# Patient Record
Sex: Male | Born: 1995 | Race: White | Hispanic: No | Marital: Single | Smoking: Never smoker
Health system: Southern US, Community
[De-identification: ages and names within clinical notes are randomized; demographics above are authoritative.]

---

## 2015-01-08 ENCOUNTER — Emergency Department (HOSPITAL_COMMUNITY): Payer: BLUE CROSS/BLUE SHIELD

## 2015-01-08 ENCOUNTER — Emergency Department (HOSPITAL_COMMUNITY)
Admission: EM | Admit: 2015-01-08 | Discharge: 2015-01-08 | Disposition: A | Discharge status: Home / Self Care | Payer: BLUE CROSS/BLUE SHIELD | Attending: Emergency Medicine | Admitting: Emergency Medicine

## 2015-01-08 ENCOUNTER — Encounter (HOSPITAL_COMMUNITY): Payer: Self-pay | Admitting: *Deleted

## 2015-01-08 DIAGNOSIS — S99922A Unspecified injury of left foot, initial encounter: Secondary | ICD-10-CM | POA: Diagnosis present

## 2015-01-08 DIAGNOSIS — S9032XA Contusion of left foot, initial encounter: Secondary | ICD-10-CM | POA: Insufficient documentation

## 2015-01-08 DIAGNOSIS — Y92320 Baseball field as the place of occurrence of the external cause: Secondary | ICD-10-CM | POA: Diagnosis not present

## 2015-01-08 DIAGNOSIS — Y998 Other external cause status: Secondary | ICD-10-CM | POA: Insufficient documentation

## 2015-01-08 DIAGNOSIS — W2103XA Struck by baseball, initial encounter: Secondary | ICD-10-CM | POA: Insufficient documentation

## 2015-01-08 DIAGNOSIS — Y9364 Activity, baseball: Secondary | ICD-10-CM | POA: Diagnosis not present

## 2015-01-08 DIAGNOSIS — M79673 Pain in unspecified foot: Secondary | ICD-10-CM

## 2015-01-08 MED ORDER — IBUPROFEN 800 MG PO TABS: 800.0000 mg | ORAL_TABLET | Freq: Three times a day (TID) | ORAL | Status: AC

## 2015-01-08 MED ORDER — IBUPROFEN 400 MG PO TABS
800.0000 mg | ORAL_TABLET | Freq: Once | ORAL | Status: AC
  Administered 2015-01-08: 800 mg via ORAL
  Filled 2015-01-08: qty 2

## 2015-01-08 NOTE — ED Notes (Signed)
Declined W/C at D/C and was escorted to lobby by RN. 

## 2015-01-08 NOTE — ED Provider Notes (Signed)
CSN: [id number]     Arrival date & time 01/08/15  1604 History  This chart was scribed for non-physician practitioner, Ladona Mow, PA-C working with Mirian Mo, MD by Greggory Stallion, ED scribe. This patient was seen in room TR09C/TR09C and the patient's care was started at 4:26 PM.    Chief Complaint  Patient presents with  . Foot Injury   The history is provided by the patient. No language interpreter was used.    HPI Comments: Erik Soto is a 19 y.o. male who presents to the Emergency Department complaining of left foot injury that occurred earlier today. He states he was playing baseball when his foot was struck with a batted ball. Pt reports sudden onset pain with associated swelling and tingling. Bearing weight and movements worsen pain. He has not yet taken anything for his symptoms but has used an ACE wrap with no relief.   History reviewed. No pertinent past medical history. History reviewed. No pertinent past surgical history. No family history on file. History  Substance Use Topics  . Smoking status: Never Smoker   . Smokeless tobacco: Not on file  . Alcohol Use: No    Review of Systems  Musculoskeletal: Positive for joint swelling and arthralgias.  All other systems reviewed and are negative.  Allergies  Review of patient's allergies indicates no known allergies.  Home Medications   Prior to Admission medications   Medication Sig Start Date End Date Taking? Authorizing Provider  ibuprofen (ADVIL,MOTRIN) 800 MG tablet Take 1 tablet (800 mg total) by mouth 3 (three) times daily. 01/08/15   Monte Fantasia, PA-C   BP 142/81 mmHg  Pulse 103  Temp(Src) 98.2 F (36.8 C) (Oral)  Resp 18  SpO2 96%   Physical Exam  Constitutional: He is oriented to person, place, and time. He appears well-developed and well-nourished. No distress.  HENT:  Head: Normocephalic and atraumatic.  Eyes: Conjunctivae and EOM are normal.  Neck: Neck supple. No tracheal deviation present.   Cardiovascular: Normal rate.   PT pulses 2+.  Pulmonary/Chest: Effort normal. No respiratory distress.  Musculoskeletal:  Moderate amount of swelling and tenderness to the dorsal aspect of proximal metatarsals of left foot. Decreased ROM secondary to pain. Distal sensation intact. Capillary refill less than 2 seconds distally.  Neurological: He is alert and oriented to person, place, and time.  Skin: Skin is warm and dry.  Psychiatric: He has a normal mood and affect. His behavior is normal.  Nursing note and vitals reviewed.   ED Course  Procedures (including critical care time)  DIAGNOSTIC STUDIES: Oxygen Saturation is 96% on RA, normal by my interpretation.    COORDINATION OF CARE: 4:28 PM-Discussed treatment plan which includes 800 mg ibuprofen and foot and ankle xrays with pt at bedside and pt agreed to plan.   Labs Review Labs Reviewed - No data to display  Imaging Review Dg Ankle Complete Left  01/08/2015   CLINICAL DATA:  Ankle pain and lateral foot pain post basketball injury today  EXAM: LEFT ANKLE COMPLETE - 3+ VIEW  COMPARISON:  None.  FINDINGS: Three views of left ankle submitted. No acute fracture or subluxation. No radiopaque foreign body. Ankle mortise is preserved.  IMPRESSION: Negative.   Electronically Signed   By: Natasha Mead M.D.   On: 01/08/2015 17:14   Dg Foot Complete Left  01/08/2015   CLINICAL DATA:  Left foot injury playing basketball today  EXAM: LEFT FOOT - COMPLETE 3+ VIEW  COMPARISON:  None.  FINDINGS: Three views of the left foot submitted. No acute fracture or subluxation. No radiopaque foreign body.  IMPRESSION: Negative.   Electronically Signed   By: Natasha MeadLiviu  Pop M.D.   On: 01/08/2015 17:14     EKG Interpretation None      MDM   Final diagnoses:  Foot pain  Foot contusion, left, initial encounter    Patient here with proximal metatarsal pain and swelling and mild ankle pain after being hit with a baseball on his foot which came straight off  of the bat. Patient neurovascularly intact, radiographs unremarkable for acute osseous injury. Patient has painful range of motion and moderate amount of swelling, cannot rule out ligamentous injury. We'll place patient in ASO brace and have him follow with orthopedics. Return precautions and RICE therapy discussed, Asian verbalizes understanding and agreement this plan. I encouraged patient to call or return to ER should he have any questions or concerns.  I personally performed the services described in this documentation, which was scribed in my presence. The recorded information has been reviewed and is accurate.  BP 142/81 mmHg  Pulse 103  Temp(Src) 98.2 F (36.8 C) (Oral)  Resp 18  SpO2 96%  Signed,  Ladona MowJoe Seven Dollens, PA-C 5:31 PM   Monte FantasiaJoseph W Carmaleta Youngers, PA-C 01/08/15 1731  Monte FantasiaJoseph W Maily Debarge, PA-C 01/08/15 1759  Mirian MoMatthew Gentry, MD 01/08/15 [phone]

## 2015-01-08 NOTE — ED Notes (Signed)
Painful  Lt foot   He was playing baseball earlier today.  His foot was struck with a batted baseball.

## 2015-01-08 NOTE — Discharge Instructions (Signed)
Foot Contusion A foot contusion is a deep bruise to the foot. Contusions are the result of an injury that caused bleeding under the skin. The contusion may turn blue, purple, or yellow. Minor injuries will give you a painless contusion, but more severe contusions may stay painful and swollen for a few weeks. CAUSES  A foot contusion comes from a direct blow to that area, such as a heavy object falling on the foot. SYMPTOMS   Swelling of the foot.  Discoloration of the foot.  Tenderness or soreness of the foot. DIAGNOSIS  You will have a physical exam and will be asked about your history. You may need an X-ray of your foot to look for a broken bone (fracture).  TREATMENT  An elastic wrap may be recommended to support your foot. Resting, elevating, and applying cold compresses to your foot are often the best treatments for a foot contusion. Over-the-counter medicines may also be recommended for pain control. HOME CARE INSTRUCTIONS   Put ice on the injured area.  Put ice in a plastic bag.  Place a towel between your skin and the bag.  Leave the ice on for 15-20 minutes, 03-04 times a day.  Only take over-the-counter or prescription medicines for pain, discomfort, or fever as directed by your caregiver.  If told, use an elastic wrap as directed. This can help reduce swelling. You may remove the wrap for sleeping, showering, and bathing. If your toes become numb, cold, or blue, take the wrap off and reapply it more loosely.  Elevate your foot with pillows to reduce swelling.  Try to avoid standing or walking while the foot is painful. Do not resume use until instructed by your caregiver. Then, begin use gradually. If pain develops, decrease use. Gradually increase activities that do not cause discomfort until you have normal use of your foot.  See your caregiver as directed. It is very important to keep all follow-up appointments in order to avoid any lasting problems with your foot,  including long-term (chronic) pain. SEEK IMMEDIATE MEDICAL CARE IF:   You have increased redness, swelling, or pain in your foot.  Your swelling or pain is not relieved with medicines.  You have loss of feeling in your foot or are unable to move your toes.  Your foot turns cold or blue.  You have pain when you move your toes.  Your foot becomes warm to the touch.  Your contusion does not improve in 2 days. MAKE SURE YOU:   Understand these instructions.  Will watch your condition.  Will get help right away if you are not doing well or get worse. Document Released: 08/26/2006 Document Revised: 05/05/2012 Document Reviewed: 10/08/2011 [facility] Patient Information 2015 [place], [place]. This information is not intended to replace advice given to you by your health care provider. Make sure you discuss any questions you have with your health care provider.  Ankle Sprain An ankle sprain is an injury to the strong, fibrous tissues (ligaments) that hold the bones of your ankle joint together.  CAUSES An ankle sprain is usually caused by a fall or by twisting your ankle. Ankle sprains most commonly occur when you step on the outer edge of your foot, and your ankle turns inward. People who participate in sports are more prone to these types of injuries.  SYMPTOMS   Pain in your ankle. The pain may be present at rest or only when you are trying to stand or walk.  Swelling.  Bruising. Bruising may  develop immediately or within 1 to 2 days after your injury.  Difficulty standing or walking, particularly when turning corners or changing directions. DIAGNOSIS  Your caregiver will ask you details about your injury and perform a physical exam of your ankle to determine if you have an ankle sprain. During the physical exam, your caregiver will press on and apply pressure to specific areas of your foot and ankle. Your caregiver will try to move your ankle in certain ways. An X-ray exam may be  done to be sure a bone was not broken or a ligament did not separate from one of the bones in your ankle (avulsion fracture).  TREATMENT  Certain types of braces can help stabilize your ankle. Your caregiver can make a recommendation for this. Your caregiver may recommend the use of medicine for pain. If your sprain is severe, your caregiver may refer you to a surgeon who helps to restore function to parts of your skeletal system (orthopedist) or a physical therapist. HOME CARE INSTRUCTIONS   Apply ice to your injury for 1-2 days or as directed by your caregiver. Applying ice helps to reduce inflammation and pain.  Put ice in a plastic bag.  Place a towel between your skin and the bag.  Leave the ice on for 15-20 minutes at a time, every 2 hours while you are awake.  Only take over-the-counter or prescription medicines for pain, discomfort, or fever as directed by your caregiver.  Elevate your injured ankle above the level of your heart as much as possible for 2-3 days.  If your caregiver recommends crutches, use them as instructed. Gradually put weight on the affected ankle. Continue to use crutches or a cane until you can walk without feeling pain in your ankle.  If you have a plaster splint, wear the splint as directed by your caregiver. Do not rest it on anything harder than a pillow for the first 24 hours. Do not put weight on it. Do not get it wet. You may take it off to take a shower or bath.  You may have been given an elastic bandage to wear around your ankle to provide support. If the elastic bandage is too tight (you have numbness or tingling in your foot or your foot becomes cold and blue), adjust the bandage to make it comfortable.  If you have an air splint, you may blow more air into it or let air out to make it more comfortable. You may take your splint off at night and before taking a shower or bath. Wiggle your toes in the splint several times per day to decrease  swelling. SEEK MEDICAL CARE IF:   You have rapidly increasing bruising or swelling.  Your toes feel extremely cold or you lose feeling in your foot.  Your pain is not relieved with medicine. SEEK IMMEDIATE MEDICAL CARE IF:  Your toes are numb or blue.  You have severe pain that is increasing. MAKE SURE YOU:   Understand these instructions.  Will watch your condition.  Will get help right away if you are not doing well or get worse. Document Released: 11/04/2005 Document Revised: 07/29/2012 Document Reviewed: 11/16/2011 [facility] Patient Information 2015 [place]. This information is not intended to replace advice given to you by your health care provider. Make sure you discuss any questions you have with your health care provider.

## 2015-12-16 IMAGING — DX DG ANKLE COMPLETE 3+V*L*
3 series · 3 of 3 positions shown · non-contrast
Comparison: None.

CLINICAL DATA: Ankle pain and lateral foot pain post basketball
injury today

EXAM:
LEFT ANKLE COMPLETE - 3+ VIEW

[ankle ap]
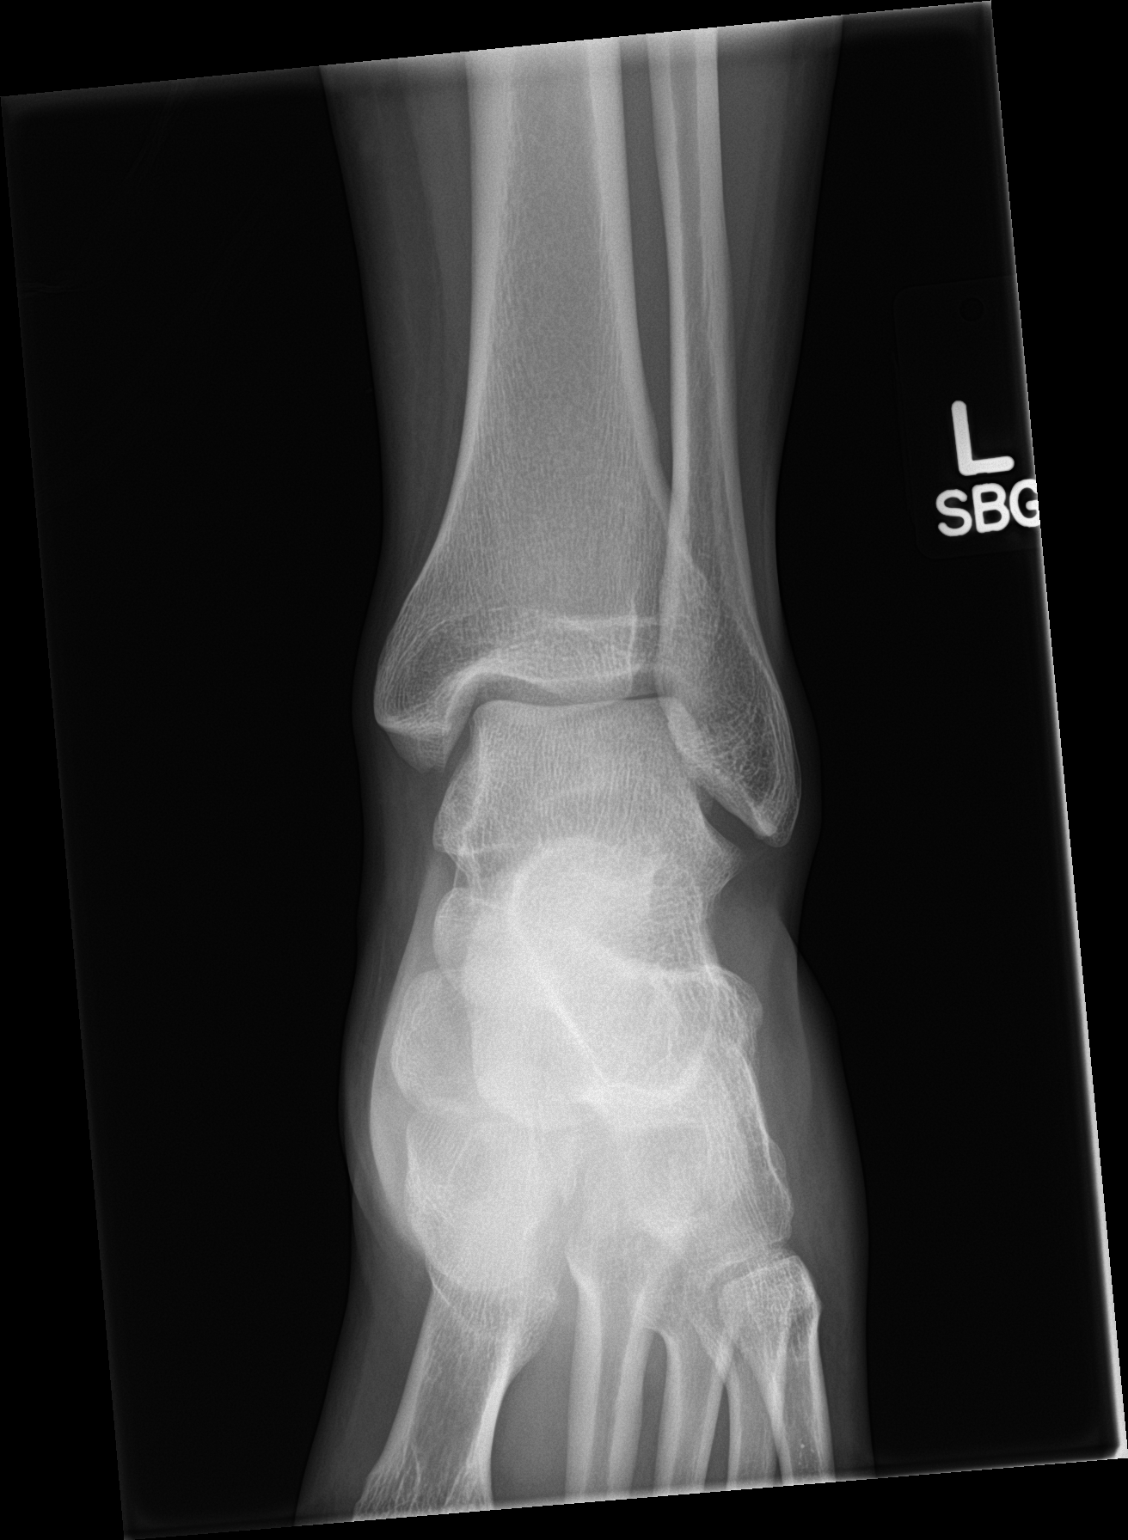

[ankle obl]
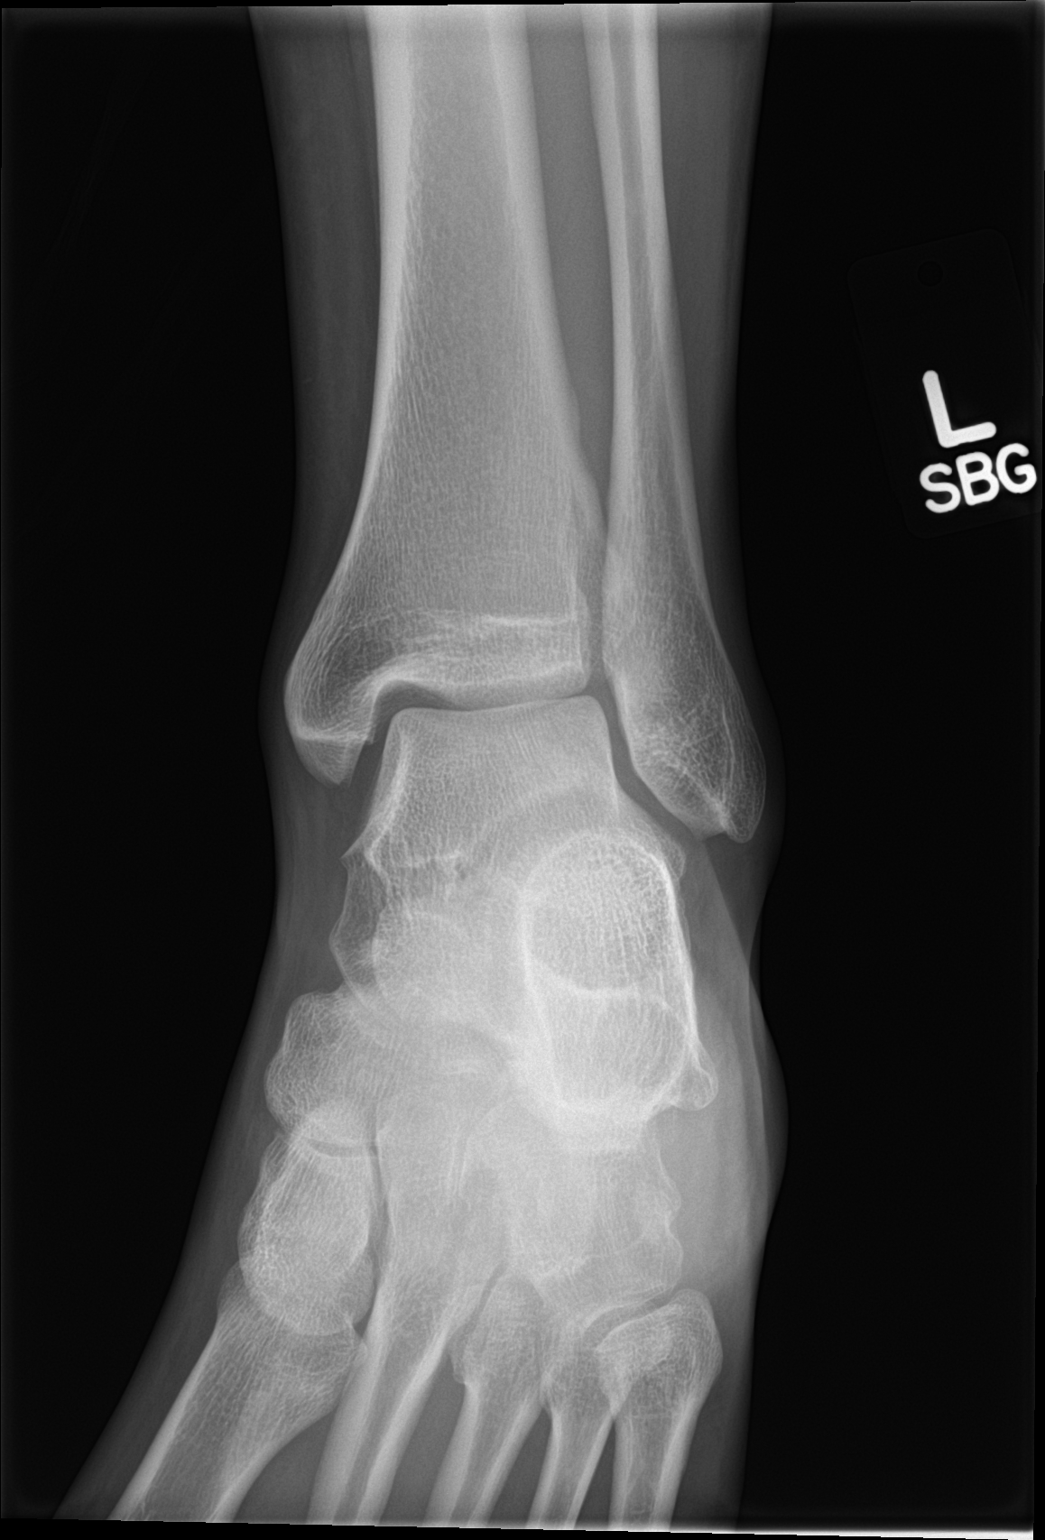

[ankle lat]
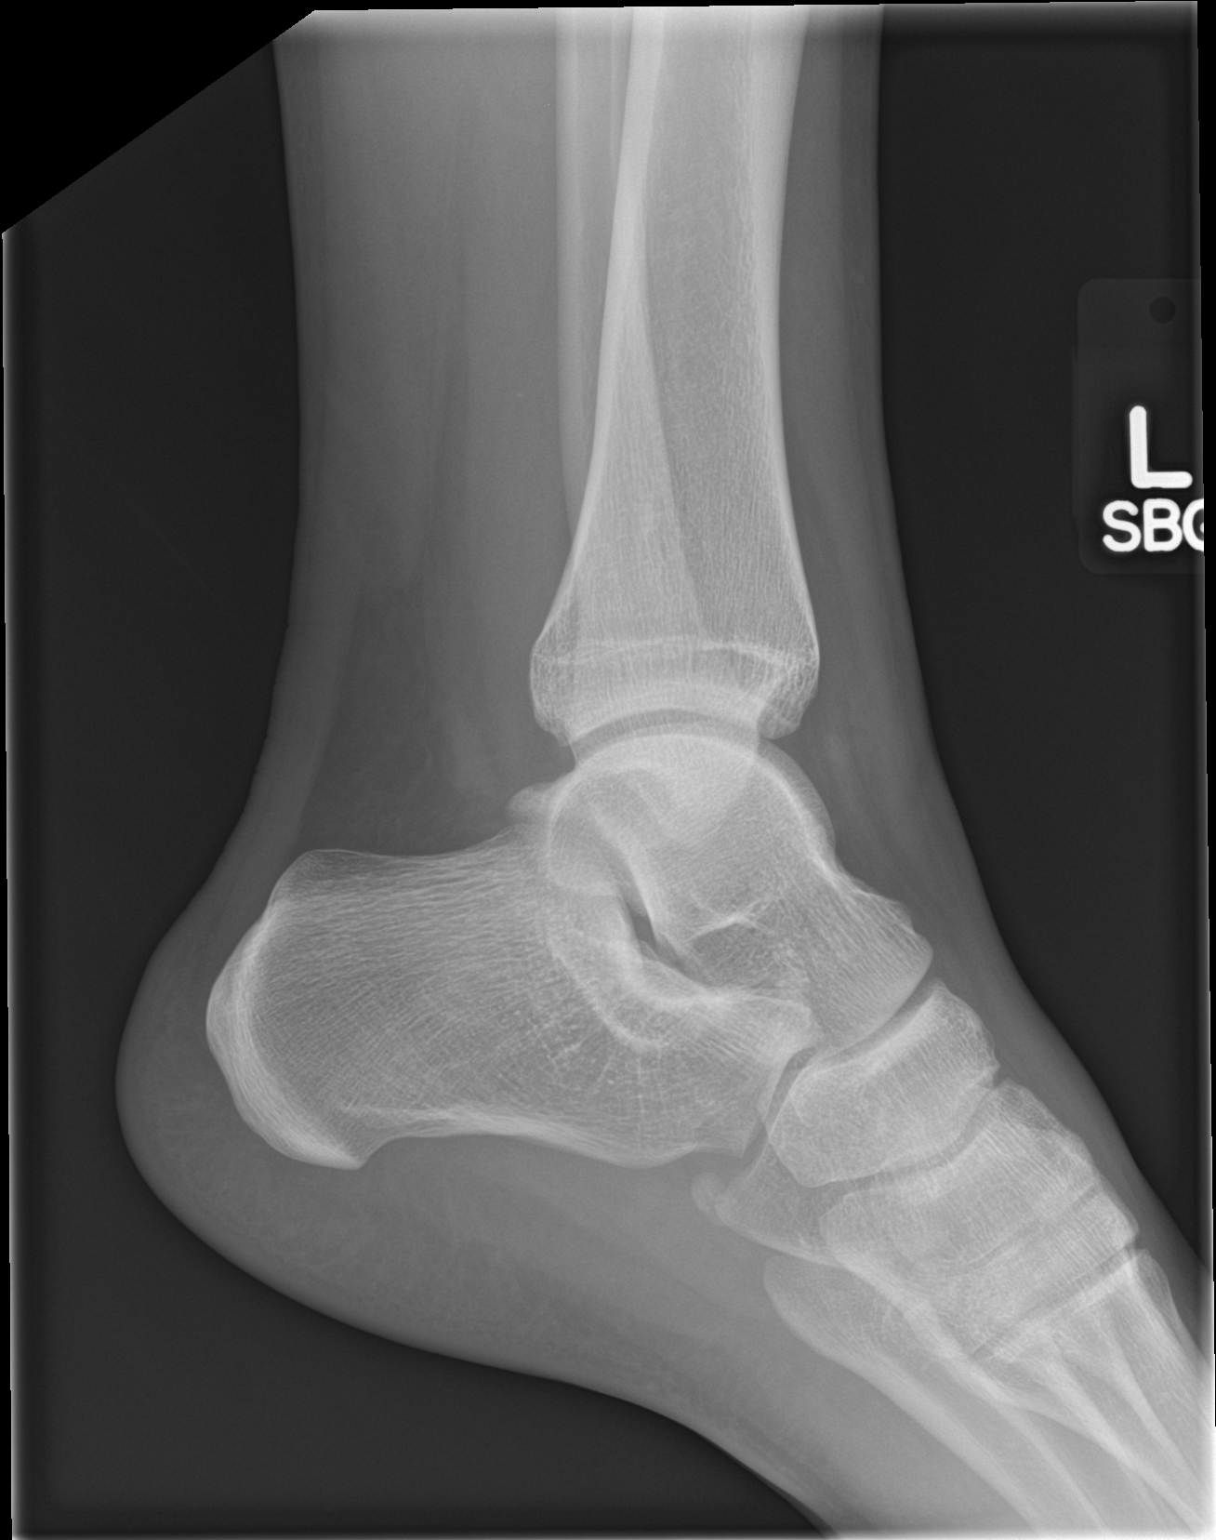

[3 of 3 positions shown; findings below may reference images not displayed]

FINDINGS: Three views of left ankle submitted. No acute fracture or
subluxation. No radiopaque foreign body. Ankle mortise is preserved.
IMPRESSION: Negative.
